# Patient Record
Sex: Female | Born: 1995 | Race: White | Hispanic: No | Marital: Single | Smoking: Never smoker
Health system: Southern US, Community
[De-identification: ages and names within clinical notes are randomized; demographics above are authoritative.]

## PROBLEM LIST (undated history)

## (undated) ENCOUNTER — Inpatient Hospital Stay (HOSPITAL_COMMUNITY): Payer: Self-pay

## (undated) DIAGNOSIS — F909 Attention-deficit hyperactivity disorder, unspecified type: Secondary | ICD-10-CM

## (undated) DIAGNOSIS — Z789 Other specified health status: Secondary | ICD-10-CM

## (undated) HISTORY — DX: Attention-deficit hyperactivity disorder, unspecified type: F90.9

## (undated) HISTORY — PX: WISDOM TOOTH EXTRACTION: SHX21

## (undated) HISTORY — PX: NO PAST SURGERIES: SHX2092

---
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
## 2013-02-02 IMAGING — US US PELVIS COMPLETE
1 series · 14 of 25 positions shown · non-contrast
Comparison: None.

CLINICAL DATA: Right-sided pelvic pain.  Irregular menses.  History
of ovarian cysts.  The



[Series 1: us pelvis complete · 0.24mm/px · 14 of 71 slices shown]
[im 1/71]
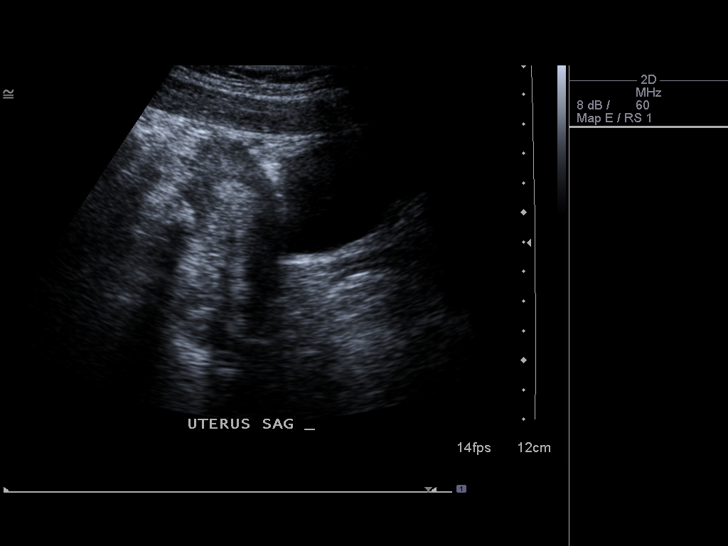
[im 6/71]
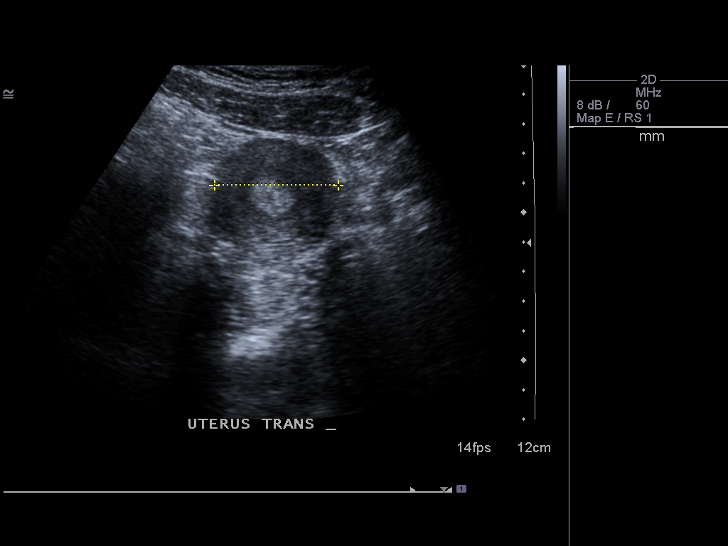
[im 12/71]
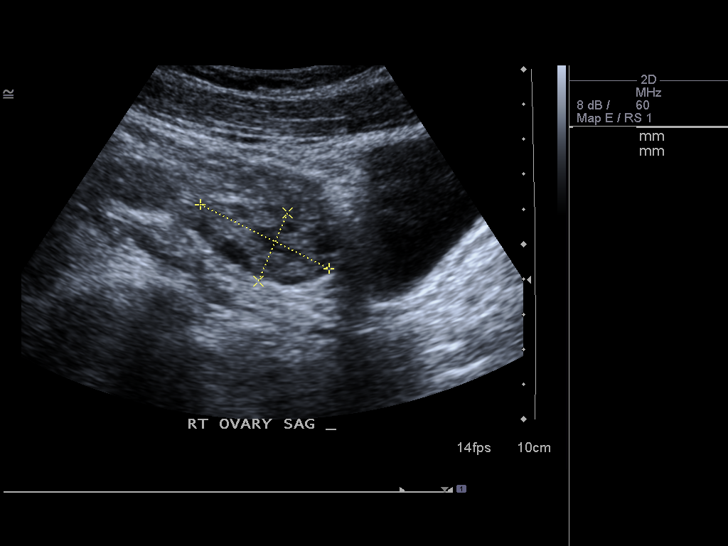
[im 18/71]
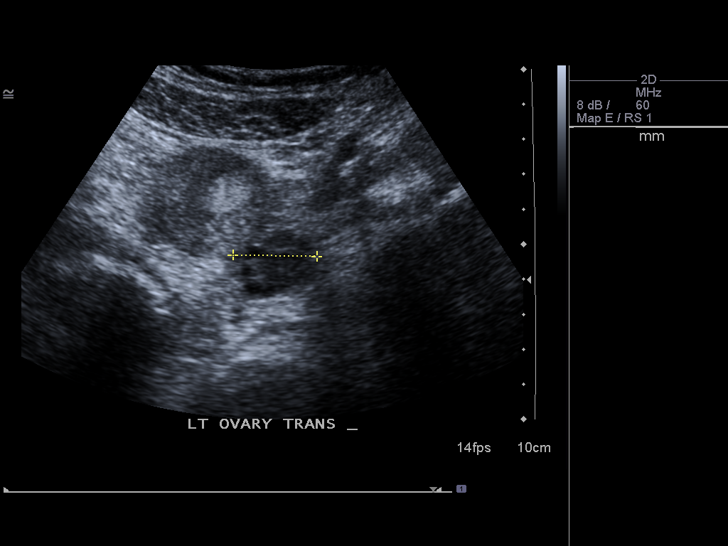
[im 24/71]
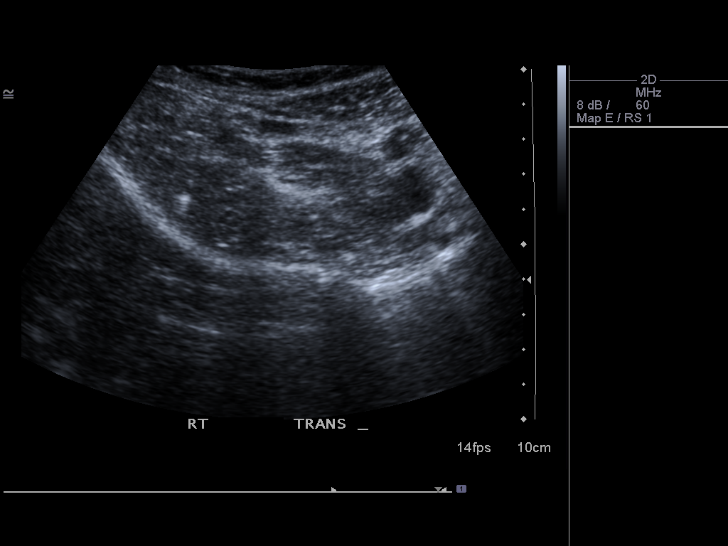
[im 27/71]
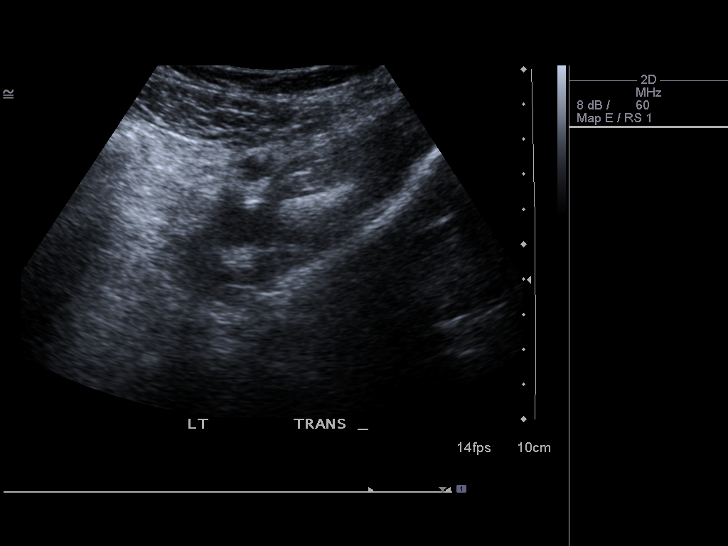
[im 33/71]
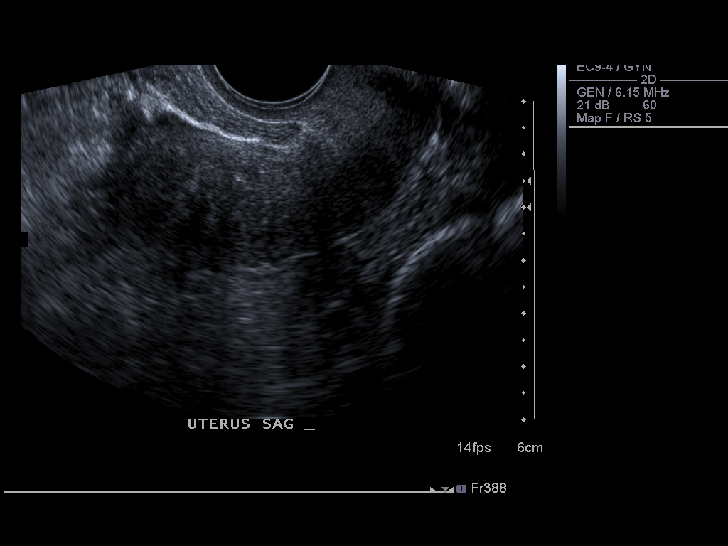
[im 38/71]
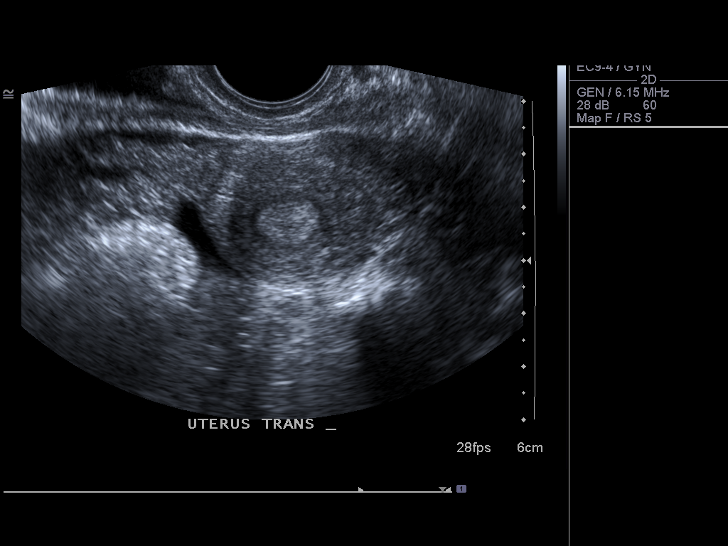
[im 44/71]
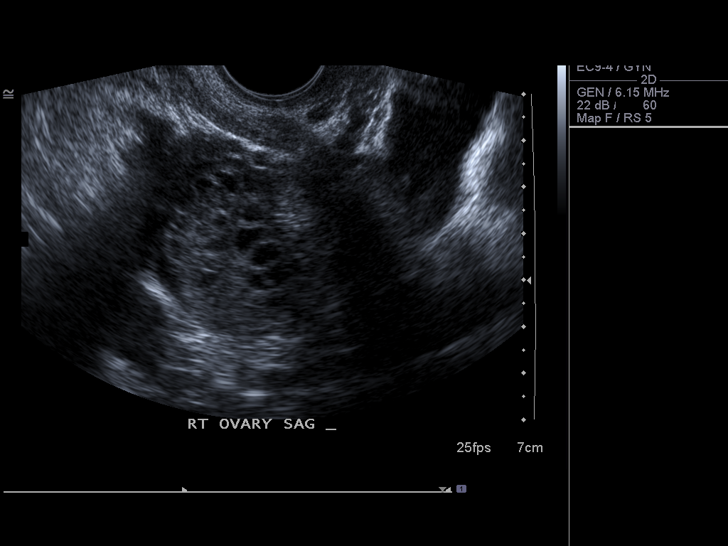
[im 47/71]
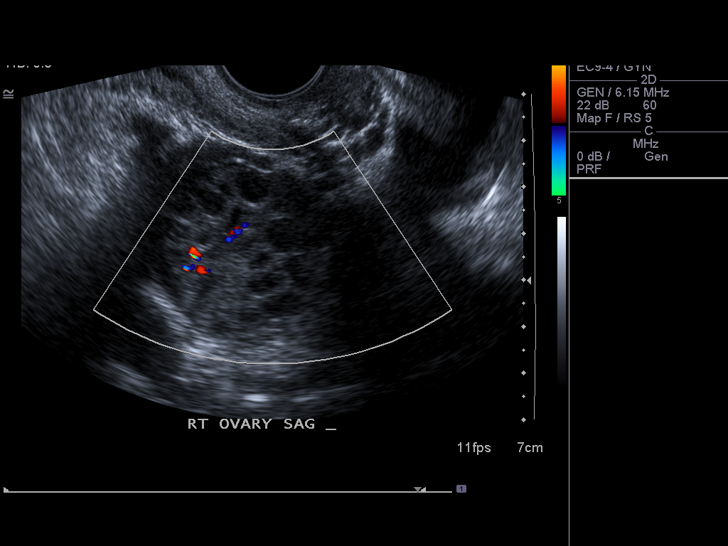
[im 53/71]
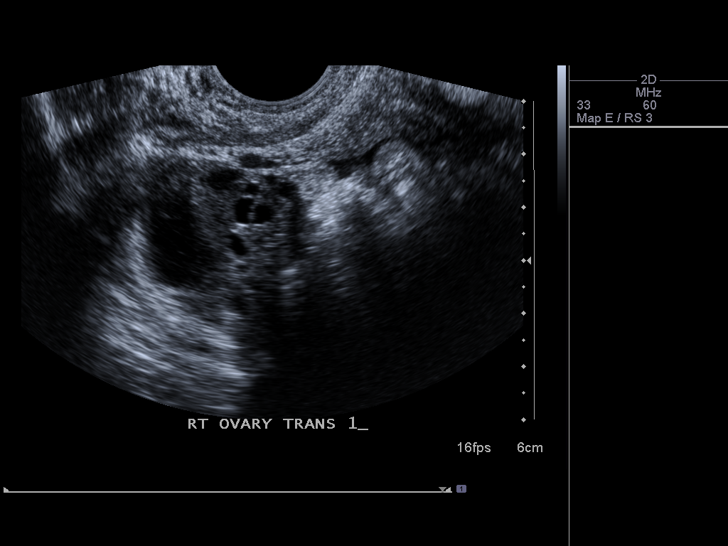
[im 59/71]
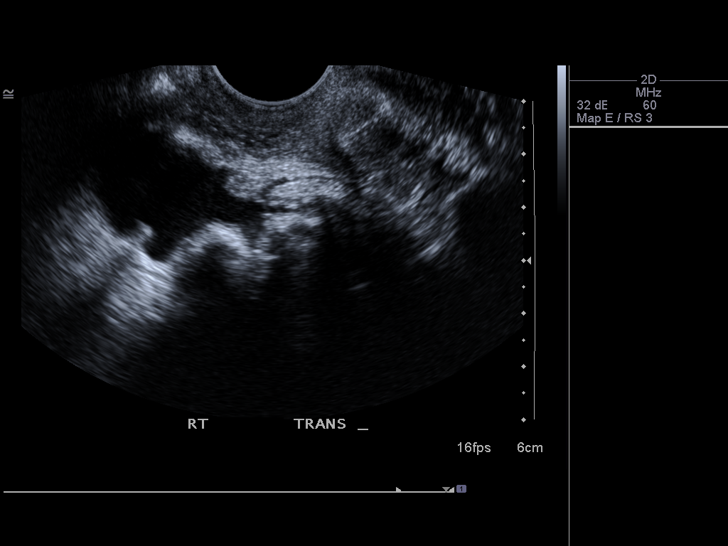
[im 65/71]
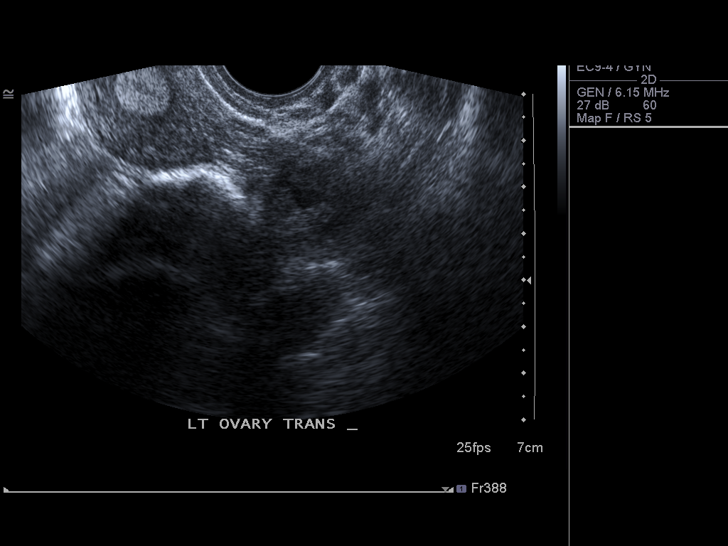
[im 71/71]
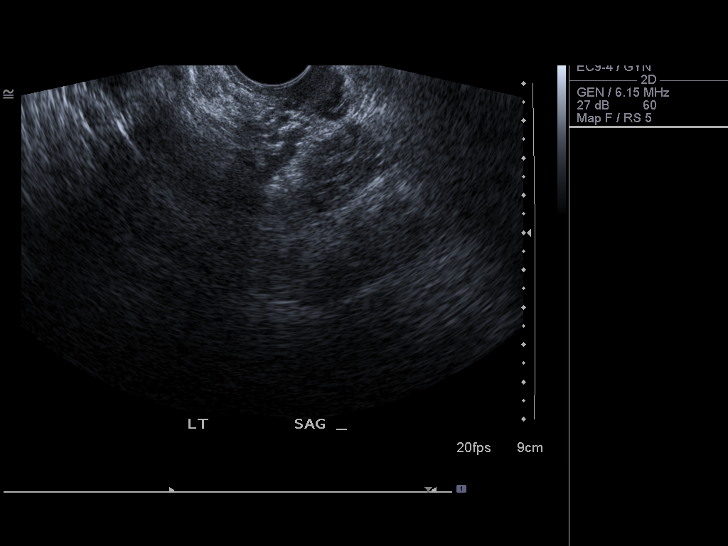

[14 of 25 positions shown; findings below may reference images not displayed]

FINDINGS: Uterus:  7.2 x 3.2 x 4.6 cm.  No fibroids other uterine mass
identified.

Endometrium: Double layer thickness measures 12 mm transvaginally.
No focal lesion visualized.

Right ovary: 4.1 x 3.1 x 2.7 cm.  Normal appearance with 2 cm
corpus luteum noted.

Left ovary: 3.3 x 2.1 x 2.0 cm.  Normal appearance.

Other Findings:  A small amount of free fluid in right adnexa and
cul-de-sac, likely physiologic.
IMPRESSION: 1.  2 cm right ovarian corpus luteum, and small amount of free
fluid which is likely physiologic.
2.  No pelvic masses or other significant abnormality identified.

## 2013-06-23 LAB — OB RESULTS CONSOLE ANTIBODY SCREEN: Antibody Screen: NEGATIVE

## 2013-06-23 LAB — OB RESULTS CONSOLE GC/CHLAMYDIA
CHLAMYDIA, DNA PROBE: NEGATIVE
GC PROBE AMP, GENITAL: NEGATIVE

## 2013-06-23 LAB — OB RESULTS CONSOLE ABO/RH: RH Type: POSITIVE

## 2013-06-23 LAB — OB RESULTS CONSOLE HEPATITIS B SURFACE ANTIGEN: HEP B S AG: NEGATIVE

## 2013-06-23 LAB — OB RESULTS CONSOLE HIV ANTIBODY (ROUTINE TESTING): HIV: NONREACTIVE

## 2013-06-23 LAB — OB RESULTS CONSOLE RUBELLA ANTIBODY, IGM: RUBELLA: IMMUNE

## 2013-06-23 LAB — OB RESULTS CONSOLE RPR: RPR: NONREACTIVE

## 2013-11-26 ENCOUNTER — Encounter (HOSPITAL_COMMUNITY): Payer: Self-pay | Admitting: *Deleted

## 2013-11-26 ENCOUNTER — Inpatient Hospital Stay (HOSPITAL_COMMUNITY)
Admission: AD | Admit: 2013-11-26 | Discharge: 2013-11-27 | Disposition: A | Discharge status: Home / Self Care | Payer: 59 | Source: Ambulatory Visit | Attending: Obstetrics | Admitting: Obstetrics

## 2013-11-26 DIAGNOSIS — O4700 False labor before 37 completed weeks of gestation, unspecified trimester: Secondary | ICD-10-CM | POA: Insufficient documentation

## 2013-11-26 DIAGNOSIS — O4703 False labor before 37 completed weeks of gestation, third trimester: Secondary | ICD-10-CM

## 2013-11-26 LAB — URINALYSIS, ROUTINE W REFLEX MICROSCOPIC
BILIRUBIN URINE: NEGATIVE
Glucose, UA: NEGATIVE mg/dL
HGB URINE DIPSTICK: NEGATIVE
KETONES UR: 40 mg/dL — AB
Leukocytes, UA: NEGATIVE
Nitrite: NEGATIVE
PH: 7.5 (ref 5.0–8.0)
Protein, ur: NEGATIVE mg/dL
SPECIFIC GRAVITY, URINE: 1.01 (ref 1.005–1.030)
Urobilinogen, UA: 1 mg/dL (ref 0.0–1.0)

## 2013-11-26 MED ORDER — NALBUPHINE HCL 10 MG/ML IJ SOLN
5.0000 mg | Freq: Once | INTRAMUSCULAR | Status: AC
  Administered 2013-11-26: 5 mg via INTRAVENOUS
  Filled 2013-11-26: qty 1

## 2013-11-26 MED ORDER — LACTATED RINGERS IV BOLUS (SEPSIS)
1000.0000 mL | Freq: Once | INTRAVENOUS | Status: AC
  Administered 2013-11-26: 1000 mL via INTRAVENOUS

## 2013-11-26 NOTE — MAU Provider Note (Signed)
History     CSN: [id number]  Arrival date and time: 11/26/13 2057   None     Chief Complaint  Patient presents with  . Abdominal Pain  . Back Pain   HPI 18 y.o. G1P0 at [redacted]w[redacted]d w/ contractions since 1830 tonight. No bleeding or LOF. Intercourse 2 hours prior to onset of UCs. + fetal movement. Uncomplicated prenatal course.   Past Medical History  Diagnosis Date  . Depressed   . Asthma     Past Surgical History  Procedure Laterality Date  . Wisdom tooth extraction      Family History  Problem Relation Age of Onset  . Alcohol abuse Neg Hx     History  Substance Use Topics  . Smoking status: Never Smoker   . Smokeless tobacco: Never Used  . Alcohol Use: No    Allergies: No Known Allergies  Prescriptions prior to admission  Medication Sig Dispense Refill  . ondansetron (ZOFRAN) 8 MG tablet Take by mouth every 8 (eight) hours as needed for nausea or vomiting.      . Prenatal Vit-Fe Fumarate-FA (PRENATAL MULTIVITAMIN) TABS tablet Take 1 tablet by mouth daily at 12 noon.      Marland Kitchen albuterol (PROVENTIL HFA;VENTOLIN HFA) 108 (90 BASE) MCG/ACT inhaler Inhale 2 puffs into the lungs every 6 (six) hours as needed for wheezing.  1 Inhaler  0  . ciprofloxacin (CIPRO) 500 MG tablet Take 1 tablet (500 mg total) by mouth 2 (two) times daily.  20 tablet  0  . escitalopram (LEXAPRO) 10 MG tablet Take 10 mg by mouth daily.      . nitrofurantoin, macrocrystal-monohydrate, (MACROBID) 100 MG capsule Take 1 capsule (100 mg total) by mouth 2 (two) times daily.  10 capsule  0  . Norethin Ace-Eth Estrad-FE (MINASTRIN 24 FE PO) Take by mouth.      . Pediatric Multivitamins-Fl (CHEWABLE MULTIVITE/FL PO) Take 1 tablet by mouth daily.      Marland Kitchen Spacer/Aero-Hold Chamber Bags MISC 1 Device by Does not apply route 4 (four) times daily as needed.  1 each  0    Review of Systems  Constitutional: Negative.   Respiratory: Negative.   Cardiovascular: Negative.   Gastrointestinal: Negative for nausea,  vomiting, abdominal pain, diarrhea and constipation.  Genitourinary: Negative for dysuria, urgency, frequency, hematuria and flank pain.       Negative for vaginal bleeding, + cramping/contractions  Musculoskeletal: Negative.   Neurological: Negative.   Psychiatric/Behavioral: Negative.    Physical Exam   Blood pressure 116/74, pulse 76, temperature 98.3 F (36.8 C), temperature source Oral, resp. rate 18, height 5' 8.5" (1.74 m), weight 169 lb 6.4 oz (76.839 kg).  Physical Exam  Nursing note and vitals reviewed. Constitutional: She is oriented to person, place, and time. She appears well-developed and well-nourished. No distress.  Cardiovascular: Normal rate.   Respiratory: Effort normal.  GI: Soft. There is no tenderness.  Genitourinary: No vaginal discharge found.   FT/thick/-2/posterior/cephalic  Musculoskeletal: Normal range of motion.  Neurological: She is alert and oriented to person, place, and time.  Skin: Skin is warm and dry.  Psychiatric: She has a normal mood and affect.   EFM: 125, mod variability, + accels TOCO: q3-4 min  MAU Course  Procedures      Follow-up Information   Follow up with Marlow Baars, MD. (as scheduled or sooner as needed)    Specialty:  Obstetrics   Contact information:   [address] [place] [address]  [phone]      Results for orders placed during the hospital encounter of 11/26/13 (from the past 24 hour(s))  URINALYSIS, ROUTINE W REFLEX MICROSCOPIC     Status: Abnormal   Collection Time    11/26/13  9:16 PM      Result Value Ref Range   Color, Urine YELLOW  YELLOW   APPearance CLEAR  CLEAR   Specific Gravity, Urine 1.010  1.005 - 1.030   pH 7.5  5.0 - 8.0   Glucose, UA NEGATIVE  NEGATIVE mg/dL   Hgb urine dipstick NEGATIVE  NEGATIVE   Bilirubin Urine NEGATIVE  NEGATIVE   Ketones, ur 40 (*) NEGATIVE mg/dL   Protein, ur NEGATIVE  NEGATIVE mg/dL   Urobilinogen, UA 1.0  0.0 - 1.0 mg/dL   Nitrite NEGATIVE   NEGATIVE   Leukocytes, UA NEGATIVE  NEGATIVE   IV hydration, Nubain 5 mg IV, pt still uncomfortable about 1 hour later, still contracting, though appear to be spacing out some (difficult to trace), pt much more comfortable and reporting fewer UCs following a second dose of Nubain 5 mg IV, repeat cervical exam with no change  Assessment and Plan   1. Preterm contractions, third trimester   No evidence of labor, rev'd precautions, encouraged hydration, f/u on Wednesday as scheduled or sooner PRN    Medication List    STOP taking these medications       CHEWABLE MULTIVITE/FL PO     escitalopram 10 MG tablet  Commonly known as:  LEXAPRO     MINASTRIN 24 FE PO     nitrofurantoin (macrocrystal-monohydrate) 100 MG capsule  Commonly known as:  MACROBID     Spacer/Aero-Hold Chamber Bags Misc      TAKE these medications       albuterol 108 (90 BASE) MCG/ACT inhaler  Commonly known as:  PROVENTIL HFA;VENTOLIN HFA  Inhale 2 puffs into the lungs every 6 (six) hours as needed for wheezing.     ciprofloxacin 500 MG tablet  Commonly known as:  CIPRO  Take 1 tablet (500 mg total) by mouth 2 (two) times daily.     ondansetron 8 MG tablet  Commonly known as:  ZOFRAN  Take by mouth every 8 (eight) hours as needed for nausea or vomiting.     prenatal multivitamin Tabs tablet  Take 1 tablet by mouth daily at 12 noon.        Follow-up Information   Follow up with Marlow Baars, MD. (as scheduled or sooner as needed)    Specialty:  Obstetrics   Contact information:   [address] [place] [address] [phone]         Georges Mouse 11/27/2013, 12:13 AM

## 2013-11-26 NOTE — MAU Note (Signed)
Since 1830 i've been feeling my abdomen tighten and having some back pain. Feels like baby has dropped.

## 2013-11-27 ENCOUNTER — Encounter (HOSPITAL_COMMUNITY): Payer: Self-pay | Admitting: Advanced Practice Midwife

## 2013-11-27 DIAGNOSIS — O4700 False labor before 37 completed weeks of gestation, unspecified trimester: Secondary | ICD-10-CM | POA: Diagnosis not present

## 2013-11-27 NOTE — Discharge Instructions (Signed)

## 2013-11-30 NOTE — MAU Provider Note (Signed)
I agree with the plan of care documented above by the CNM.  False labor.

## 2013-12-13 LAB — OB RESULTS CONSOLE GBS: GBS: NEGATIVE

## 2013-12-23 ENCOUNTER — Telehealth (HOSPITAL_COMMUNITY): Payer: Self-pay | Admitting: *Deleted

## 2013-12-23 ENCOUNTER — Encounter (HOSPITAL_COMMUNITY): Payer: Self-pay | Admitting: *Deleted

## 2013-12-23 NOTE — Telephone Encounter (Signed)
Preadmission screen  

## 2013-12-25 MED ORDER — TERBUTALINE SULFATE 1 MG/ML IJ SOLN
0.2500 mg | Freq: Once | INTRAMUSCULAR | Status: AC
  Administered 2013-12-26: 0.25 mg via SUBCUTANEOUS
  Filled 2013-12-25: qty 1

## 2013-12-26 ENCOUNTER — Encounter (HOSPITAL_COMMUNITY): Payer: Self-pay

## 2013-12-26 ENCOUNTER — Observation Stay (HOSPITAL_COMMUNITY)
Admission: RE | Admit: 2013-12-26 | Discharge: 2013-12-26 | Disposition: A | Discharge status: Home / Self Care | Payer: 59 | Source: Ambulatory Visit | Attending: Obstetrics | Admitting: Obstetrics

## 2013-12-26 VITALS — BP 116/78 | HR 82 | Temp 98.2°F | Ht 68.5 in | Wt 168.0 lb

## 2013-12-26 DIAGNOSIS — J45909 Unspecified asthma, uncomplicated: Secondary | ICD-10-CM | POA: Diagnosis not present

## 2013-12-26 DIAGNOSIS — IMO0002 Reserved for concepts with insufficient information to code with codable children: Secondary | ICD-10-CM | POA: Diagnosis not present

## 2013-12-26 DIAGNOSIS — F909 Attention-deficit hyperactivity disorder, unspecified type: Secondary | ICD-10-CM | POA: Insufficient documentation

## 2013-12-26 DIAGNOSIS — O329XX1 Maternal care for malpresentation of fetus, unspecified, fetus 1: Secondary | ICD-10-CM

## 2013-12-26 DIAGNOSIS — O329XX0 Maternal care for malpresentation of fetus, unspecified, not applicable or unspecified: Secondary | ICD-10-CM | POA: Diagnosis present

## 2013-12-26 LAB — CBC
HEMATOCRIT: 40.2 % (ref 36.0–46.0)
HEMOGLOBIN: 14.1 g/dL (ref 12.0–15.0)
MCH: 30.7 pg (ref 26.0–34.0)
MCHC: 35.1 g/dL (ref 30.0–36.0)
MCV: 87.4 fL (ref 78.0–100.0)
Platelets: 202 10*3/uL (ref 150–400)
RBC: 4.6 MIL/uL (ref 3.87–5.11)
RDW: 12.8 % (ref 11.5–15.5)
WBC: 7 10*3/uL (ref 4.0–10.5)

## 2013-12-26 MED ORDER — LACTATED RINGERS IV SOLN: 500.0000 mL | Freq: Once | INTRAVENOUS | Status: DC

## 2013-12-26 MED ORDER — CITRIC ACID-SODIUM CITRATE 334-500 MG/5ML PO SOLN
30.0000 mL | ORAL | Status: DC | PRN
  Filled 2013-12-26: qty 15

## 2013-12-26 MED ORDER — PHENYLEPHRINE 40 MCG/ML (10ML) SYRINGE FOR IV PUSH (FOR BLOOD PRESSURE SUPPORT): 80.0000 ug | PREFILLED_SYRINGE | INTRAVENOUS | Status: DC | PRN

## 2013-12-26 MED ORDER — TERBUTALINE SULFATE 1 MG/ML IJ SOLN
INTRAMUSCULAR | Status: AC
  Administered 2013-12-26: 0.25 mg via SUBCUTANEOUS
  Filled 2013-12-26: qty 1

## 2013-12-26 MED ORDER — LACTATED RINGERS IV SOLN: 500.0000 mL | INTRAVENOUS | Status: DC | PRN

## 2013-12-26 MED ORDER — EPHEDRINE 5 MG/ML INJ: 10.0000 mg | INTRAVENOUS | Status: DC | PRN

## 2013-12-26 MED ORDER — ONDANSETRON HCL 4 MG/2ML IJ SOLN: 4.0000 mg | Freq: Four times a day (QID) | INTRAMUSCULAR | Status: DC | PRN

## 2013-12-26 MED ORDER — LACTATED RINGERS IV SOLN: INTRAVENOUS | Status: DC

## 2013-12-26 MED ORDER — FENTANYL 2.5 MCG/ML BUPIVACAINE 1/10 % EPIDURAL INFUSION (WH - ANES): 14.0000 mL/h | INTRAMUSCULAR | Status: DC | PRN

## 2013-12-26 MED ORDER — DIPHENHYDRAMINE HCL 50 MG/ML IJ SOLN: 12.5000 mg | INTRAMUSCULAR | Status: DC | PRN

## 2013-12-26 NOTE — Discharge Summary (Signed)
Obstetric Discharge Summary Reason for Admission: fetal malpresentation, attempted ECV Prenatal Procedures: ECV Complications-Operative and Postpartum: attempted ECV, not successful1 hr of fetal monitoring performed after attempted version, Cat 1 and reassuring.   Hemoglobin  Date Value Ref Range Status  12/26/2013 14.1  12.0 - 15.0 g/dL Final     HCT  Date Value Ref Range Status  12/26/2013 40.2  36.0 - 46.0 % Final    Physical Exam:  General: alert, cooperative and no distress Uterus: soft  Discharge Diagnoses: fetal malpresentation  Discharge Information: Date: 12/26/2013 Activity: unrestricted Diet: routine Medications: PNV Condition: stable Instructions: refer to practice specific booklet Discharge to: home   Marlow Baars 12/26/2013, 9:39 AM

## 2013-12-26 NOTE — Discharge Instructions (Signed)
External Cephalic Version External cephalic version is turning a baby that is presenting his or her buttocks first (breech) or is lying sideways in the uterus (transverse) to a head-first position. This makes the labor and delivery faster, safer for the mother and baby, and lessens the chance for a cesarean section. It should not be tried until the pregnancy is [redacted] weeks along or longer.   HOME CARE INSTRUCTIONS   Have someone take you home after the procedure.  Rest at home for several hours.  Have someone stay with you for a few hours after you get home.  After ECV, continue with your prenatal visits as directed.  Continue your regular diet, rest and activities.  Do not do any strenuous activities for a couple of days. SEEK IMMEDIATE MEDICAL CARE IF:   You develop vaginal bleeding.  You have fluid coming out of your vagina (bag of water may have broken).  You develop uterine contractions.  You do not feel the baby move or there is less movement of the baby.  You develop abdominal pain.  You develop an oral temperature of 101 F (38.9 C) or higher. Document Released: 09/09/2006 Document Revised: 08/01/2013 Document Reviewed: 07/05/2008 [facility] Patient Information 2015 [place], [place]. This information is not intended to replace advice given to you by your health care provider. Make sure you discuss any questions you have with your health care provider. Braxton Hicks Contractions Contractions of the uterus can occur throughout pregnancy. Contractions are not always a sign that you are in labor.  WHAT ARE BRAXTON HICKS CONTRACTIONS?  Contractions that occur before labor are called Braxton Hicks contractions, or false labor. Toward the end of pregnancy (32-34 weeks), these contractions can develop more often and may become more forceful. This is not true labor because these contractions do not result in opening (dilatation) and thinning of the cervix. They are sometimes difficult to  tell apart from true labor because these contractions can be forceful and people have different pain tolerances. You should not feel embarrassed if you go to the hospital with false labor. Sometimes, the only way to tell if you are in true labor is for your health care provider to look for changes in the cervix. If there are no prenatal problems or other health problems associated with the pregnancy, it is completely safe to be sent home with false labor and await the onset of true labor. HOW CAN YOU TELL THE DIFFERENCE BETWEEN TRUE AND FALSE LABOR? False Labor  The contractions of false labor are usually shorter and not as hard as those of true labor.   The contractions are usually irregular.   The contractions are often felt in the front of the lower abdomen and in the groin.   The contractions may go away when you walk around or change positions while lying down.   The contractions get weaker and are shorter lasting as time goes on.   The contractions do not usually become progressively stronger, regular, and closer together as with true labor.  True Labor  Contractions in true labor last 30-70 seconds, become very regular, usually become more intense, and increase in frequency.   The contractions do not go away with walking.   The discomfort is usually felt in the top of the uterus and spreads to the lower abdomen and low back.   True labor can be determined by your health care provider with an exam. This will show that the cervix is dilating and getting thinner.  WHAT TO REMEMBER  Keep up with your usual exercises and follow other instructions given by your health care provider.   Take medicines as directed by your health care provider.   Keep your regular prenatal appointments.   Eat and drink lightly if you think you are going into labor.   If Braxton Hicks contractions are making you uncomfortable:   Change your position from lying down or resting to  walking, or from walking to resting.   Sit and rest in a tub of warm water.   Drink 2-3 glasses of water. Dehydration may cause these contractions.   Do slow and deep breathing several times an hour.  WHEN SHOULD I SEEK IMMEDIATE MEDICAL CARE? Seek immediate medical care if:  Your contractions become stronger, more regular, and closer together.   You have fluid leaking or gushing from your vagina.   You have a fever.   You pass blood-tinged mucus.   You have vaginal bleeding.   You have continuous abdominal pain.   You have low back pain that you never had before.   You feel your baby's head pushing down and causing pelvic pressure.   Your baby is not moving as much as it used to.  Document Released: 03/17/2005 Document Revised: 03/22/2013 Document Reviewed: 12/27/2012 [facility] Patient Information 2015 [place], [place]. This information is not intended to replace advice given to you by your health care provider. Make sure you discuss any questions you have with your health care provider. Fetal Movement Counts Patient Name: __________________________________________________ Patient Due Date: ____________________ Performing a fetal movement count is highly recommended in high-risk pregnancies, but it is good for every pregnant woman to do. Your health care provider may ask you to start counting fetal movements at 28 weeks of the pregnancy. Fetal movements often increase:  After eating a full meal.  After physical activity.  After eating or drinking something sweet or cold.  At rest. Pay attention to when you feel the baby is most active. This will help you notice a pattern of your baby's sleep and wake cycles and what factors contribute to an increase in fetal movement. It is important to perform a fetal movement count at the same time each day when your baby is normally most active.  HOW TO COUNT FETAL MOVEMENTS 1. Find a quiet and comfortable area to sit or lie  down on your left side. Lying on your left side provides the best blood and oxygen circulation to your baby. 2. Write down the day and time on a sheet of paper or in a journal. 3. Start counting kicks, flutters, swishes, rolls, or jabs in a 2-hour period. You should feel at least 10 movements within 2 hours. 4. If you do not feel 10 movements in 2 hours, wait 2-3 hours and count again. Look for a change in the pattern or not enough counts in 2 hours. SEEK MEDICAL CARE IF:  You feel less than 10 counts in 2 hours, tried twice.  There is no movement in over an hour.  The pattern is changing or taking longer each day to reach 10 counts in 2 hours.  You feel the baby is not moving as he or she usually does. Date: ____________ Movements: ____________ Start time: ____________ Doreatha Martin time: ____________  Date: ____________ Movements: ____________ Start time: ____________ Doreatha Martin time: ____________ Date: ____________ Movements: ____________ Start time: ____________ Doreatha Martin time: ____________ Date: ____________ Movements: ____________ Start time: ____________ Doreatha Martin time: ____________ Date: ____________ Movements: ____________ Start time: ____________  Finish time: ____________ Date: ____________ Movements: ____________ Start time: ____________ Doreatha Martin time: ____________ Date: ____________ Movements: ____________ Start time: ____________ Doreatha Martin time: ____________ Date: ____________ Movements: ____________ Start time: ____________ Doreatha Martin time: ____________  Date: ____________ Movements: ____________ Start time: ____________ Doreatha Martin time: ____________ Date: ____________ Movements: ____________ Start time: ____________ Doreatha Martin time: ____________ Date: ____________ Movements: ____________ Start time: ____________ Doreatha Martin time: ____________ Date: ____________ Movements: ____________ Start time: ____________ Doreatha Martin time: ____________ Date: ____________ Movements: ____________ Start time: ____________ Doreatha Martin time:  ____________ Date: ____________ Movements: ____________ Start time: ____________ Doreatha Martin time: ____________ Date: ____________ Movements: ____________ Start time: ____________ Doreatha Martin time: ____________  Date: ____________ Movements: ____________ Start time: ____________ Doreatha Martin time: ____________ Date: ____________ Movements: ____________ Start time: ____________ Doreatha Martin time: ____________ Date: ____________ Movements: ____________ Start time: ____________ Doreatha Martin time: ____________ Date: ____________ Movements: ____________ Start time: ____________ Doreatha Martin time: ____________ Date: ____________ Movements: ____________ Start time: ____________ Doreatha Martin time: ____________ Date: ____________ Movements: ____________ Start time: ____________ Doreatha Martin time: ____________ Date: ____________ Movements: ____________ Start time: ____________ Doreatha Martin time: ____________  Date: ____________ Movements: ____________ Start time: ____________ Doreatha Martin time: ____________ Date: ____________ Movements: ____________ Start time: ____________ Doreatha Martin time: ____________ Date: ____________ Movements: ____________ Start time: ____________ Doreatha Martin time: ____________ Date: ____________ Movements: ____________ Start time: ____________ Doreatha Martin time: ____________ Date: ____________ Movements: ____________ Start time: ____________ Doreatha Martin time: ____________ Date: ____________ Movements: ____________ Start time: ____________ Doreatha Martin time: ____________ Date: ____________ Movements: ____________ Start time: ____________ Doreatha Martin time: ____________  Date: ____________ Movements: ____________ Start time: ____________ Doreatha Martin time: ____________ Date: ____________ Movements: ____________ Start time: ____________ Doreatha Martin time: ____________ Date: ____________ Movements: ____________ Start time: ____________ Doreatha Martin time: ____________ Date: ____________ Movements: ____________ Start time: ____________ Doreatha Martin time: ____________ Date: ____________ Movements:  ____________ Start time: ____________ Doreatha Martin time: ____________ Date: ____________ Movements: ____________ Start time: ____________ Doreatha Martin time: ____________ Date: ____________ Movements: ____________ Start time: ____________ Doreatha Martin time: ____________  Date: ____________ Movements: ____________ Start time: ____________ Doreatha Martin time: ____________ Date: ____________ Movements: ____________ Start time: ____________ Doreatha Martin time: ____________ Date: ____________ Movements: ____________ Start time: ____________ Doreatha Martin time: ____________ Date: ____________ Movements: ____________ Start time: ____________ Doreatha Martin time: ____________ Date: ____________ Movements: ____________ Start time: ____________ Doreatha Martin time: ____________ Date: ____________ Movements: ____________ Start time: ____________ Doreatha Martin time: ____________ Date: ____________ Movements: ____________ Start time: ____________ Doreatha Martin time: ____________  Date: ____________ Movements: ____________ Start time: ____________ Doreatha Martin time: ____________ Date: ____________ Movements: ____________ Start time: ____________ Doreatha Martin time: ____________ Date: ____________ Movements: ____________ Start time: ____________ Doreatha Martin time: ____________ Date: ____________ Movements: ____________ Start time: ____________ Doreatha Martin time: ____________ Date: ____________ Movements: ____________ Start time: ____________ Doreatha Martin time: ____________ Date: ____________ Movements: ____________ Start time: ____________ Doreatha Martin time: ____________ Date: ____________ Movements: ____________ Start time: ____________ Doreatha Martin time: ____________  Date: ____________ Movements: ____________ Start time: ____________ Doreatha Martin time: ____________ Date: ____________ Movements: ____________ Start time: ____________ Doreatha Martin time: ____________ Date: ____________ Movements: ____________ Start time: ____________ Doreatha Martin time: ____________ Date: ____________ Movements: ____________ Start time: ____________ Doreatha Martin  time: ____________ Date: ____________ Movements: ____________ Start time: ____________ Doreatha Martin time: ____________ Date: ____________ Movements: ____________ Start time: ____________ Doreatha Martin time: ____________ Document Released: 04/16/2006 Document Revised: 08/01/2013 Document Reviewed: 01/12/2012 [employer] Patient Information 2015 [place]. This information is not intended to replace advice given to you by your health care provider. Make sure you discuss any questions you have with your health care provider.

## 2013-12-26 NOTE — Procedures (Addendum)
Procedure Note  Surgeons: Marlow Baars, MD and Malva Limes, MD  Bedside ultrasound confirmed complete breech position prior to the start of the procedure.  The breech was not well engaged in the pelvis.  The head was in the LUQ.  Spine was lateral/anterior.  Placenta was anterior/left lateral.   Fluid was normal. Patient declined epidural anesthesia.  0.25mg  [place] terbutaline was given 5 minutes prior to the start of the procedure.  Fetal monitoring prior to the start of the procedure was reassuring. The breech was elevated out of of the pelvis.  Slow steady pressure was used to attempt a backward somersault into the vertex position twice.  An additional attempt at a forward somersault to obtain a vertex position was made.  All attempts were unsuccessful.  The breech was easily elevated out of the pelvis, but the head was unable to be displaced from the LUQ.  Fetal heart tones were obtained between each attempt.  One decel to the 70s was noted after the first version attempt.  This returned to baseline w good variability prior to the next attempt.  After 3 attempts at version, due to patient discomfort, the procedure was stopped.  Fetal monitors were replaced w reassuring fetal status.  Following 1 hr of monitoring, the patient will be discharged home.

## 2013-12-26 NOTE — H&P (Signed)
18 y.o. G1P0 @ [redacted]w[redacted]d presents for external cephalic version.  [place] last week showed complete breech presentation.  Otherwise has good fetal movement and no bleeding.  Denies ctx Placenta Anterior   Past Medical History  Diagnosis Date  . Asthma     exercise induced  . ADHD (attention deficit hyperactivity disorder)     Past Surgical History  Procedure Laterality Date  . Wisdom tooth extraction      OB History  Gravida Para Term Preterm AB SAB TAB Ectopic Multiple Living  2 0   1 1    0    # Outcome Date GA Lbr Len/2nd Weight Sex Delivery Anes PTL Lv  2 CUR           1 SAB               History   Social History  . Marital Status: Single    Spouse Name: N/A    Number of Children: N/A  . Years of Education: N/A   Occupational History  . Not on file.   Social History Main Topics  . Smoking status: Never Smoker   . Smokeless tobacco: Never Used  . Alcohol Use: No  . Drug Use: No  . Sexual Activity: Yes   Other Topics Concern  . Not on file   Social History Narrative  . No narrative on file   Review of patient's allergies indicates no known allergies.    Prenatal Transfer Tool  Maternal Diabetes: No Genetic Screening: Normal Maternal Ultrasounds/Referrals: complete breech, anterior placenta Fetal Ultrasounds or other Referrals:  None Maternal Substance Abuse:  No Significant Maternal Medications:  None Significant Maternal Lab Results: Lab values include: Group B Strep negative  Other PNC: uncomplicated.    There were no vitals filed for this visit.   General:  NAD Lungs: CTAB Cardiac: RRR Abdomen:  soft, gravid, EFW 6# Ex:  tr edema SVE:  Closed/long FHTs:  120s, mod var Toco:  Rare ctx  BSUS today confirms complete breech presentation.  Fetal head maternal left, spine anterior/lateral.  Placenta anterior.  AFI normal.    A/P   18 y.o. [redacted]w[redacted]d  G1P0 presents with complete breech presentation, confirmed on bedside ultrasound this AM.  Plan for ECV.   Epidural if pt desires (cbc pending), terb prior to start of procedure.  Reviewed risks of ECV, including PROM, placental abruption or NRFS resulting in urgent cesarean delivery, failure of version, or successful version followed by return to breech presentation prior to onset of spontaneous labor.  Pt agrees to ECV.  Consented for ECV and cesarean delivery    [place], [place]

## 2013-12-28 ENCOUNTER — Encounter (HOSPITAL_COMMUNITY): Payer: Self-pay

## 2013-12-28 NOTE — Patient Instructions (Addendum)
Your procedure is scheduled on:12/30/13  Enter through the Main Entrance at : 10 am Pick up desk phone and dial [phone] and inform us of your arrival.  Please call [phone] if you have any problems the morning of surgery.  Remember: Do not eat food or drink liquids, including water, after midnight:tonight Clear liquids are ok until:7am  You may brush your teeth the morning of surgery.   DO NOT wear jewelry, eye make-up, lipstick,body lotion, or dark fingernail polish.  (Polished toes are ok) You may wear deodorant.  If you are to be admitted after surgery, leave suitcase in car until your room has been assigned. Patients discharged on the day of surgery will not be allowed to drive home. Wear loose fitting, comfortable clothes for your ride home.

## 2013-12-29 ENCOUNTER — Encounter (HOSPITAL_COMMUNITY): Payer: Self-pay

## 2013-12-29 ENCOUNTER — Encounter (HOSPITAL_COMMUNITY)
Admission: RE | Admit: 2013-12-29 | Discharge: 2013-12-29 | Disposition: A | Discharge status: Home / Self Care | Payer: 59 | Source: Ambulatory Visit | Attending: Obstetrics | Admitting: Obstetrics

## 2013-12-29 HISTORY — DX: Other specified health status: Z78.9

## 2013-12-29 LAB — TYPE AND SCREEN
ABO/RH(D): O POS
Antibody Screen: NEGATIVE

## 2013-12-29 LAB — RPR

## 2013-12-29 LAB — CBC
HEMATOCRIT: 43 % (ref 36.0–46.0)
HEMOGLOBIN: 14.9 g/dL (ref 12.0–15.0)
MCH: 30.7 pg (ref 26.0–34.0)
MCHC: 34.7 g/dL (ref 30.0–36.0)
MCV: 88.7 fL (ref 78.0–100.0)
Platelets: 214 10*3/uL (ref 150–400)
RBC: 4.85 MIL/uL (ref 3.87–5.11)
RDW: 12.9 % (ref 11.5–15.5)
WBC: 7.7 10*3/uL (ref 4.0–10.5)

## 2013-12-29 LAB — ABO/RH: ABO/RH(D): O POS

## 2013-12-30 ENCOUNTER — Encounter (HOSPITAL_COMMUNITY): Payer: Self-pay | Admitting: Anesthesiology

## 2013-12-30 ENCOUNTER — Inpatient Hospital Stay (HOSPITAL_COMMUNITY)
Admission: RE | Admit: 2013-12-30 | Discharge: 2014-01-02 | DRG: 765 | Disposition: A | Discharge status: Home / Self Care | Payer: 59 | Source: Ambulatory Visit | Attending: Obstetrics | Admitting: Obstetrics

## 2013-12-30 ENCOUNTER — Encounter (HOSPITAL_COMMUNITY): Payer: 59 | Admitting: Anesthesiology

## 2013-12-30 ENCOUNTER — Encounter (HOSPITAL_COMMUNITY)
Admission: RE | Disposition: A | Discharge status: Home / Self Care | Payer: Self-pay | Source: Ambulatory Visit | Attending: Obstetrics

## 2013-12-30 ENCOUNTER — Inpatient Hospital Stay (HOSPITAL_COMMUNITY): Payer: 59 | Admitting: Anesthesiology

## 2013-12-30 DIAGNOSIS — O34593 Maternal care for other abnormalities of gravid uterus, third trimester: Secondary | ICD-10-CM | POA: Diagnosis present

## 2013-12-30 DIAGNOSIS — O329XX0 Maternal care for malpresentation of fetus, unspecified, not applicable or unspecified: Secondary | ICD-10-CM | POA: Diagnosis present

## 2013-12-30 DIAGNOSIS — O4103X0 Oligohydramnios, third trimester, not applicable or unspecified: Secondary | ICD-10-CM | POA: Diagnosis present

## 2013-12-30 DIAGNOSIS — O321XX0 Maternal care for breech presentation, not applicable or unspecified: Secondary | ICD-10-CM | POA: Diagnosis present

## 2013-12-30 DIAGNOSIS — Q514 Unicornate uterus: Secondary | ICD-10-CM | POA: Diagnosis not present

## 2013-12-30 DIAGNOSIS — Z3A39 39 weeks gestation of pregnancy: Secondary | ICD-10-CM | POA: Diagnosis present

## 2013-12-30 SURGERY — Surgical Case
Anesthesia: Spinal | Site: Abdomen

## 2013-12-30 MED ORDER — FENTANYL CITRATE 0.05 MG/ML IJ SOLN
INTRAMUSCULAR | Status: DC | PRN
  Administered 2013-12-30: 15 ug via INTRATHECAL

## 2013-12-30 MED ORDER — KETOROLAC TROMETHAMINE 30 MG/ML IJ SOLN
INTRAMUSCULAR | Status: AC
  Administered 2013-12-30: 30 mg via INTRAMUSCULAR
  Filled 2013-12-30: qty 1

## 2013-12-30 MED ORDER — LACTATED RINGERS IV SOLN
INTRAVENOUS | Status: DC
  Administered 2013-12-31: 10:00:00 via INTRAVENOUS

## 2013-12-30 MED ORDER — SIMETHICONE 80 MG PO CHEW
80.0000 mg | CHEWABLE_TABLET | Freq: Three times a day (TID) | ORAL | Status: DC
  Administered 2013-12-31 – 2014-01-02 (×7): 80 mg via ORAL
  Filled 2013-12-30 (×7): qty 1

## 2013-12-30 MED ORDER — ONDANSETRON HCL 4 MG/2ML IJ SOLN: 4.0000 mg | INTRAMUSCULAR | Status: DC | PRN

## 2013-12-30 MED ORDER — PRENATAL MULTIVITAMIN CH
1.0000 | ORAL_TABLET | Freq: Every day | ORAL | Status: DC
  Administered 2013-12-31 – 2014-01-02 (×3): 1 via ORAL
  Filled 2013-12-30 (×3): qty 1

## 2013-12-30 MED ORDER — MORPHINE SULFATE (PF) 0.5 MG/ML IJ SOLN
INTRAMUSCULAR | Status: DC | PRN
  Administered 2013-12-30: .1 mg via INTRATHECAL

## 2013-12-30 MED ORDER — PNEUMOCOCCAL VAC POLYVALENT 25 MCG/0.5ML IJ INJ
0.5000 mL | INJECTION | INTRAMUSCULAR | Status: DC
  Filled 2013-12-30: qty 0.5

## 2013-12-30 MED ORDER — OXYTOCIN 40 UNITS IN LACTATED RINGERS INFUSION - SIMPLE MED: 62.5000 mL/h | INTRAVENOUS | Status: AC

## 2013-12-30 MED ORDER — OXYTOCIN 10 UNIT/ML IJ SOLN
INTRAMUSCULAR | Status: AC
  Filled 2013-12-30: qty 4

## 2013-12-30 MED ORDER — MENTHOL 3 MG MT LOZG: 1.0000 | LOZENGE | OROMUCOSAL | Status: DC | PRN

## 2013-12-30 MED ORDER — MORPHINE SULFATE 0.5 MG/ML IJ SOLN
INTRAMUSCULAR | Status: AC
  Filled 2013-12-30: qty 10

## 2013-12-30 MED ORDER — KETOROLAC TROMETHAMINE 30 MG/ML IJ SOLN: 30.0000 mg | Freq: Four times a day (QID) | INTRAMUSCULAR | Status: AC | PRN

## 2013-12-30 MED ORDER — NALBUPHINE HCL 10 MG/ML IJ SOLN: 5.0000 mg | INTRAMUSCULAR | Status: DC | PRN

## 2013-12-30 MED ORDER — PHENYLEPHRINE 8 MG IN D5W 100 ML (0.08MG/ML) PREMIX OPTIME
INJECTION | INTRAVENOUS | Status: DC | PRN
  Administered 2013-12-30: 60 ug/min via INTRAVENOUS

## 2013-12-30 MED ORDER — SCOPOLAMINE 1 MG/3DAYS TD PT72: 1.0000 | MEDICATED_PATCH | Freq: Once | TRANSDERMAL | Status: DC

## 2013-12-30 MED ORDER — DIPHENHYDRAMINE HCL 25 MG PO CAPS
25.0000 mg | ORAL_CAPSULE | ORAL | Status: DC | PRN
  Filled 2013-12-30: qty 1

## 2013-12-30 MED ORDER — OXYTOCIN 40 UNITS IN LACTATED RINGERS INFUSION - SIMPLE MED
INTRAVENOUS | Status: DC | PRN
  Administered 2013-12-30: 40 [IU] via INTRAVENOUS

## 2013-12-30 MED ORDER — FENTANYL CITRATE 0.05 MG/ML IJ SOLN
INTRAMUSCULAR | Status: AC
  Filled 2013-12-30: qty 2

## 2013-12-30 MED ORDER — OXYCODONE-ACETAMINOPHEN 5-325 MG PO TABS
2.0000 | ORAL_TABLET | ORAL | Status: DC | PRN
  Administered 2013-12-31 – 2014-01-02 (×5): 2 via ORAL
  Filled 2013-12-30 (×5): qty 2

## 2013-12-30 MED ORDER — CEFAZOLIN SODIUM-DEXTROSE 2-3 GM-% IV SOLR
INTRAVENOUS | Status: AC
  Filled 2013-12-30: qty 50

## 2013-12-30 MED ORDER — CEFAZOLIN SODIUM-DEXTROSE 2-3 GM-% IV SOLR
2.0000 g | INTRAVENOUS | Status: AC
  Administered 2013-12-30: 2 g via INTRAVENOUS

## 2013-12-30 MED ORDER — NALBUPHINE HCL 10 MG/ML IJ SOLN: 5.0000 mg | Freq: Once | INTRAMUSCULAR | Status: AC | PRN

## 2013-12-30 MED ORDER — PHENYLEPHRINE 8 MG IN D5W 100 ML (0.08MG/ML) PREMIX OPTIME
INJECTION | INTRAVENOUS | Status: AC
  Filled 2013-12-30: qty 100

## 2013-12-30 MED ORDER — ONDANSETRON HCL 4 MG/2ML IJ SOLN
INTRAMUSCULAR | Status: AC
  Filled 2013-12-30: qty 2

## 2013-12-30 MED ORDER — LACTATED RINGERS IV SOLN
Freq: Once | INTRAVENOUS | Status: AC
  Administered 2013-12-30: 10:00:00 via INTRAVENOUS

## 2013-12-30 MED ORDER — INFLUENZA VAC SPLIT QUAD 0.5 ML IM SUSY
0.5000 mL | PREFILLED_SYRINGE | INTRAMUSCULAR | Status: AC
  Administered 2013-12-31: 0.5 mL via INTRAMUSCULAR
  Filled 2013-12-30: qty 0.5

## 2013-12-30 MED ORDER — PHENYLEPHRINE 40 MCG/ML (10ML) SYRINGE FOR IV PUSH (FOR BLOOD PRESSURE SUPPORT)
PREFILLED_SYRINGE | INTRAVENOUS | Status: AC
  Filled 2013-12-30: qty 5

## 2013-12-30 MED ORDER — SENNOSIDES-DOCUSATE SODIUM 8.6-50 MG PO TABS
2.0000 | ORAL_TABLET | ORAL | Status: DC
  Administered 2013-12-30 – 2014-01-01 (×3): 2 via ORAL
  Filled 2013-12-30 (×3): qty 2

## 2013-12-30 MED ORDER — DEXTROSE 5 % IV SOLN
1.0000 ug/kg/h | INTRAVENOUS | Status: DC | PRN
  Filled 2013-12-30: qty 2

## 2013-12-30 MED ORDER — DIBUCAINE 1 % RE OINT: 1.0000 "application " | TOPICAL_OINTMENT | RECTAL | Status: DC | PRN

## 2013-12-30 MED ORDER — SIMETHICONE 80 MG PO CHEW
80.0000 mg | CHEWABLE_TABLET | ORAL | Status: DC
  Administered 2013-12-30 – 2014-01-01 (×3): 80 mg via ORAL
  Filled 2013-12-30 (×3): qty 1

## 2013-12-30 MED ORDER — ONDANSETRON HCL 4 MG PO TABS: 4.0000 mg | ORAL_TABLET | ORAL | Status: DC | PRN

## 2013-12-30 MED ORDER — ONDANSETRON HCL 4 MG/2ML IJ SOLN: 4.0000 mg | Freq: Three times a day (TID) | INTRAMUSCULAR | Status: DC | PRN

## 2013-12-30 MED ORDER — PHENYLEPHRINE HCL 10 MG/ML IJ SOLN
INTRAMUSCULAR | Status: AC
  Filled 2013-12-30: qty 1

## 2013-12-30 MED ORDER — SCOPOLAMINE 1 MG/3DAYS TD PT72
MEDICATED_PATCH | TRANSDERMAL | Status: AC
  Filled 2013-12-30: qty 1

## 2013-12-30 MED ORDER — LIDOCAINE-EPINEPHRINE (PF) 2 %-1:200000 IJ SOLN
INTRAMUSCULAR | Status: AC
  Filled 2013-12-30: qty 20

## 2013-12-30 MED ORDER — MEPERIDINE HCL 25 MG/ML IJ SOLN: 6.2500 mg | INTRAMUSCULAR | Status: DC | PRN

## 2013-12-30 MED ORDER — KETOROLAC TROMETHAMINE 30 MG/ML IJ SOLN
30.0000 mg | Freq: Four times a day (QID) | INTRAMUSCULAR | Status: AC | PRN
  Administered 2013-12-30: 30 mg via INTRAMUSCULAR

## 2013-12-30 MED ORDER — SIMETHICONE 80 MG PO CHEW
80.0000 mg | CHEWABLE_TABLET | ORAL | Status: DC | PRN
Start: 2013-12-30 — End: 2014-01-02

## 2013-12-30 MED ORDER — IBUPROFEN 600 MG PO TABS
600.0000 mg | ORAL_TABLET | Freq: Four times a day (QID) | ORAL | Status: DC
  Administered 2013-12-30 – 2014-01-02 (×11): 600 mg via ORAL
  Filled 2013-12-30 (×11): qty 1

## 2013-12-30 MED ORDER — NALOXONE HCL 0.4 MG/ML IJ SOLN: 0.4000 mg | INTRAMUSCULAR | Status: DC | PRN

## 2013-12-30 MED ORDER — SODIUM BICARBONATE 8.4 % IV SOLN
INTRAVENOUS | Status: AC
  Filled 2013-12-30: qty 50

## 2013-12-30 MED ORDER — TETANUS-DIPHTH-ACELL PERTUSSIS 5-2.5-18.5 LF-MCG/0.5 IM SUSP
0.5000 mL | Freq: Once | INTRAMUSCULAR | Status: AC
  Administered 2013-12-31: 0.5 mL via INTRAMUSCULAR
  Filled 2013-12-30: qty 0.5

## 2013-12-30 MED ORDER — SCOPOLAMINE 1 MG/3DAYS TD PT72
1.0000 | MEDICATED_PATCH | Freq: Once | TRANSDERMAL | Status: DC
  Administered 2013-12-30: 1.5 mg via TRANSDERMAL

## 2013-12-30 MED ORDER — BUPIVACAINE IN DEXTROSE 0.75-8.25 % IT SOLN
INTRATHECAL | Status: DC | PRN
  Administered 2013-12-30: 1.8 mL via INTRATHECAL

## 2013-12-30 MED ORDER — OXYCODONE-ACETAMINOPHEN 5-325 MG PO TABS
1.0000 | ORAL_TABLET | ORAL | Status: DC | PRN
  Administered 2013-12-31 – 2014-01-02 (×2): 1 via ORAL
  Filled 2013-12-30 (×2): qty 1

## 2013-12-30 MED ORDER — SODIUM CHLORIDE 0.9 % IJ SOLN: 3.0000 mL | INTRAMUSCULAR | Status: DC | PRN

## 2013-12-30 MED ORDER — DIPHENHYDRAMINE HCL 50 MG/ML IJ SOLN: 12.5000 mg | INTRAMUSCULAR | Status: DC | PRN

## 2013-12-30 MED ORDER — ONDANSETRON HCL 4 MG/2ML IJ SOLN
INTRAMUSCULAR | Status: DC | PRN
  Administered 2013-12-30: 4 mg via INTRAVENOUS

## 2013-12-30 MED ORDER — LACTATED RINGERS IV SOLN
INTRAVENOUS | Status: DC | PRN
  Administered 2013-12-30 (×3): via INTRAVENOUS

## 2013-12-30 MED ORDER — HYDROMORPHONE HCL 1 MG/ML IJ SOLN: 0.2500 mg | INTRAMUSCULAR | Status: DC | PRN

## 2013-12-30 MED ORDER — WITCH HAZEL-GLYCERIN EX PADS: 1.0000 "application " | MEDICATED_PAD | CUTANEOUS | Status: DC | PRN

## 2013-12-30 MED ORDER — DIPHENHYDRAMINE HCL 25 MG PO CAPS: 25.0000 mg | ORAL_CAPSULE | Freq: Four times a day (QID) | ORAL | Status: DC | PRN

## 2013-12-30 MED ORDER — LANOLIN HYDROUS EX OINT: 1.0000 "application " | TOPICAL_OINTMENT | CUTANEOUS | Status: DC | PRN

## 2013-12-30 SURGICAL SUPPLY — 36 items
APL SKNCLS STERI-STRIP NONHPOA (GAUZE/BANDAGES/DRESSINGS) ×1
BENZOIN TINCTURE PRP APPL 2/3 (GAUZE/BANDAGES/DRESSINGS) ×1 IMPLANT
CLAMP CORD UMBIL (MISCELLANEOUS) ×1 IMPLANT
CLOTH BEACON ORANGE TIMEOUT ST (SAFETY) ×2 IMPLANT
COVER LIGHT HANDLE  1/PK (MISCELLANEOUS) ×2
COVER LIGHT HANDLE 1/PK (MISCELLANEOUS) ×2 IMPLANT
DRAPE SHEET LG 3/4 BI-LAMINATE (DRAPES) ×1 IMPLANT
DRSG OPSITE POSTOP 4X10 (GAUZE/BANDAGES/DRESSINGS) ×2 IMPLANT
DURAPREP 26ML APPLICATOR (WOUND CARE) ×2 IMPLANT
ELECT REM PT RETURN 9FT ADLT (ELECTROSURGICAL) ×2
ELECTRODE REM PT RTRN 9FT ADLT (ELECTROSURGICAL) ×1 IMPLANT
GLOVE BIO SURGEON STRL SZ 6 (GLOVE) ×3 IMPLANT
GLOVE BIO SURGEON STRL SZ 6.5 (GLOVE) ×4 IMPLANT
GLOVE BIO SURGEON STRL SZ7 (GLOVE) ×1 IMPLANT
GLOVE BIOGEL PI IND STRL 6.5 (GLOVE) IMPLANT
GLOVE BIOGEL PI INDICATOR 6.5 (GLOVE) ×4
GLOVE INDICATOR 6.0 STRL GRN (GLOVE) ×2 IMPLANT
GLOVE SURG SS PI 7.0 STRL IVOR (GLOVE) ×7 IMPLANT
GOWN STRL REUS W/TWL LRG LVL3 (GOWN DISPOSABLE) ×7 IMPLANT
NS IRRIG 1000ML POUR BTL (IV SOLUTION) ×2 IMPLANT
PACK C SECTION WH (CUSTOM PROCEDURE TRAY) ×2 IMPLANT
PAD OB MATERNITY 4.3X12.25 (PERSONAL CARE ITEMS) ×2 IMPLANT
STRIP CLOSURE SKIN 1/2X4 (GAUZE/BANDAGES/DRESSINGS) ×1 IMPLANT
SUT MNCRL 0 VIOLET CTX 36 (SUTURE) ×2 IMPLANT
SUT MNCRL AB 3-0 PS2 27 (SUTURE) ×2 IMPLANT
SUT MONOCRYL 0 CTX 36 (SUTURE) ×2
SUT PLAIN 0 NONE (SUTURE) ×1 IMPLANT
SUT PLAIN 2 0 (SUTURE) ×2
SUT PLAIN ABS 2-0 CT1 27XMFL (SUTURE) ×1 IMPLANT
SUT VIC AB 0 CT1 27 (SUTURE) ×4
SUT VIC AB 0 CT1 27XBRD ANBCTR (SUTURE) ×2 IMPLANT
SUT VIC AB 2-0 CT1 27 (SUTURE) ×2
SUT VIC AB 2-0 CT1 TAPERPNT 27 (SUTURE) IMPLANT
TOWEL OR 17X24 6PK STRL BLUE (TOWEL DISPOSABLE) ×4 IMPLANT
TRAY FOLEY CATH 14FR (SET/KITS/TRAYS/PACK) ×2 IMPLANT
WATER STERILE IRR 1000ML POUR (IV SOLUTION) ×2 IMPLANT

## 2013-12-30 NOTE — Addendum Note (Signed)
Addendum created 12/30/13 1539 by Collier FlowersElizabeth J Ragna Kramlich, CRNA   Modules edited: Notes Section   Notes Section:  File: [id number]

## 2013-12-30 NOTE — Anesthesia Procedure Notes (Signed)
Spinal  Patient location during procedure: OR Start time: 12/30/2013 12:09 PM Staffing Anesthesiologist: CASSIDY, AMY Performed by: anesthesiologist  Preanesthetic Checklist Completed: patient identified, site marked, surgical consent, pre-op evaluation, timeout performed, IV checked, risks and benefits discussed and monitors and equipment checked Spinal Block Patient position: sitting Prep: site prepped and draped and DuraPrep Patient monitoring: blood pressure, continuous pulse ox and heart rate Approach: midline Location: L3-4 Injection technique: single-shot Needle Needle type: Pencan  Needle gauge: 24 G Needle length: 10 cm Assessment Sensory level: T4 Additional Notes Clear free flow CSF on first attempt.  No paresthesia.  Patient tolerated procedure well with no apparent complications.  Jasmine DecemberA. Cassidy, MD

## 2013-12-30 NOTE — H&P (Addendum)
18 y.o. G2P0010 @ 5354w1d presents for primary cesarean section for malpresentation.  She failed an ECV at 38wks.     Past Medical History  Diagnosis Date  . Asthma     exercise induced  . ADHD (attention deficit hyperactivity disorder)   . Medical history non-contributory     Past Surgical History  Procedure Laterality Date  . Wisdom tooth extraction    . No past surgeries      OB History  Gravida Para Term Preterm AB SAB TAB Ectopic Multiple Living  2 0   1 1    0    # Outcome Date GA Lbr Len/2nd Weight Sex Delivery Anes PTL Lv  2 CUR           1 SAB               History   Social History  . Marital Status: Single    Spouse Name: N/A    Number of Children: N/A  . Years of Education: N/A   Occupational History  . Not on file.   Social History Main Topics  . Smoking status: Never Smoker   . Smokeless tobacco: Never Used  . Alcohol Use: No  . Drug Use: No  . Sexual Activity: Yes   Other Topics Concern  . Not on file   Social History Narrative  . No narrative on file   Review of patient's allergies indicates no known allergies.    Prenatal Transfer Tool  Maternal Diabetes: No Genetic Screening: Normal Maternal Ultrasounds/Referrals: complete breech, anterior placenta Fetal Ultrasounds or other Referrals:  None Maternal Substance Abuse:  No Significant Maternal Medications:  None Significant Maternal Lab Results: Lab values include: Group B Strep negative  Other PNC: uncomplicated.  Growth US 9/24 : EFW 2673 g (19%)    Filed Vitals:   12/30/13 0959  BP: 117/91  Pulse: 103  Temp: 98.2 F (36.8 C)  Resp: 16     General:  NAD Lungs: CTAB Cardiac: RRR Abdomen:  soft, gravid, EFW 6# Ex:  tr edema SVE:  deferred FHTs:  130s Toco:  Rare ctx  BSUS today confirms complete breech presentation.   A/P   18 y.o.  G2P0010 @ 11054w1d with malpresentation here for cesarean delivery. Consent reviewed, including risks of infection, bleeding, damage to  surrounding structures (including bowel, bladder, tubes, ovaries, baby), need for blood transfusion or additional procedures.  Consent signed.    [place], [facility]

## 2013-12-30 NOTE — Brief Op Note (Signed)
12/30/2013  1:20 PM  PATIENT:  Halford ChessmanBriana C Raden  18 y.o. female  PRE-OPERATIVE DIAGNOSIS:  BREECH  POST-OPERATIVE DIAGNOSIS:  BREECH  PROCEDURE:  Procedure(s): CESAREAN SECTION (N/A)  SURGEON:  Surgeon(s) and Role:    * Marlow Baarsyanna Kerra Guilfoil, MD - Primary    * Essie HartWalda Pinn, MD - Assisting  PHYSICIAN ASSISTANT:   ANESTHESIA:   spinal  EBL:  Total I/O In: 2300 [I.V.:2300] Out: 1100 [Urine:450; Blood:650]  BLOOD ADMINISTERED:none  DRAINS: Urinary Catheter (Foley)   LOCAL MEDICATIONS USED:  NONE  SPECIMEN:  Source of Specimen:  Placenta  DISPOSITION OF SPECIMEN:  to L&D  COUNTS:  YES  TOURNIQUET:  * No tourniquets in log *  DICTATION: .Note written in EPIC  PLAN OF CARE: Admit to inpatient   PATIENT DISPOSITION:  PACU - hemodynamically stable.   Delay start of Pharmacological VTE agent (>24hrs) due to surgical blood loss or risk of bleeding: not applicable

## 2013-12-30 NOTE — Anesthesia Postprocedure Evaluation (Signed)
  Anesthesia Post-op Note  Anesthesia Post Note  Patient: Alejandra Campbell  Procedure(s) Performed: Procedure(s) (LRB): CESAREAN SECTION (N/A)  Anesthesia type: Spinal  Patient location: PACU  Post pain: Pain level controlled  Post assessment: Post-op Vital signs reviewed  Last Vitals:  Filed Vitals:   12/30/13 1345  BP:   Pulse: 73  Temp:   Resp: 20    Post vital signs: Reviewed  Level of consciousness: awake  Complications: No apparent anesthesia complications

## 2013-12-30 NOTE — Transfer of Care (Signed)
Immediate Anesthesia Transfer of Care Note  Patient: Alejandra Campbell  Procedure(s) Performed: Procedure(s): CESAREAN SECTION (N/A)  Patient Location: PACU  Anesthesia Type:Spinal  Level of Consciousness: awake, alert , oriented and patient cooperative  Airway & Oxygen Therapy: Patient Spontanous Breathing  Post-op Assessment: Report given to PACU RN and Post -op Vital signs reviewed and stable  Post vital signs: Reviewed and stable  Complications: No apparent anesthesia complications

## 2013-12-30 NOTE — Anesthesia Postprocedure Evaluation (Signed)
  Anesthesia Post-op Note  Patient: Alejandra Campbell  Procedure(s) Performed: Procedure(s): CESAREAN SECTION (N/A)  Patient Location: Mother/Baby  Anesthesia Type:Spinal  Level of Consciousness: awake, alert , oriented and patient cooperative  Airway and Oxygen Therapy: Patient Spontanous Breathing  Post-op Pain: mild  Post-op Assessment: Post-op Vital signs reviewed, Patient's Cardiovascular Status Stable, Respiratory Function Stable, Patent Airway, No signs of Nausea or vomiting, Adequate PO intake and Pain level controlled  Post-op Vital Signs: Reviewed and stable  Last Vitals:  Filed Vitals:   12/30/13 1430  BP: 113/64  Pulse: 70  Temp:   Resp: 26    Complications: No apparent anesthesia complications

## 2013-12-30 NOTE — Anesthesia Preprocedure Evaluation (Signed)
Anesthesia Evaluation  Patient identified by MRN, date of birth, ID band Patient awake    Reviewed: Allergy & Precautions, H&P , Patient's Chart, lab work & pertinent test results  Airway Mallampati: II TM Distance: >3 FB Neck ROM: full    Dental no notable dental hx.    Pulmonary asthma (Chest clear, no recent difficulties) ,  breath sounds clear to auscultation  Pulmonary exam normal       Cardiovascular Exercise Tolerance: Good Rhythm:regular Rate:Normal     Neuro/Psych    GI/Hepatic   Endo/Other    Renal/GU      Musculoskeletal   Abdominal   Peds  Hematology   Anesthesia Other Findings   Reproductive/Obstetrics                           Anesthesia Physical Anesthesia Plan  ASA: II  Anesthesia Plan: Spinal   Post-op Pain Management:    Induction:   Airway Management Planned:   Additional Equipment:   Intra-op Plan:   Post-operative Plan:   Informed Consent: I have reviewed the patients History and Physical, chart, labs and discussed the procedure including the risks, benefits and alternatives for the proposed anesthesia with the patient or authorized representative who has indicated his/her understanding and acceptance.   Dental Advisory Given  Plan Discussed with: CRNA  Anesthesia Plan Comments: (Lab work confirmed with CRNA in room. Platelets okay. Discussed spinal anesthetic, and patient consents to the procedure:  included risk of possible headache,backache, failed block, allergic reaction, and nerve injury. This patient was asked if she had any questions or concerns before the procedure started. )        Anesthesia Quick Evaluation

## 2013-12-30 NOTE — Op Note (Signed)
Cesarean Section Procedure Note  Pre-operative Diagnosis: 1. Intrauterine pregnancy at [redacted]w[redacted]d  2. Breech presentation  Post-operative Diagnosis: same as above  Surgeon: Marlow Baars, MD  Assistants: Essie Hart, [facility]  Procedure: Primary low transverse cesarean section  Anesthesia: Spinal anesthesia  Estimated Blood Loss: 650 mL         Drains: Foley catheter         Specimens: Placenta to L&D         Implants: none         Complications:  None; patient tolerated the procedure well.         Disposition: PACU - hemodynamically stable.  Findings:  Normal tubes and ovaries bilaterally.  The uterus was abnormally shaped.  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  There were no abnormalities palpated within the uterine cavity.  The baby was in a completely breech presentation with head maternal left, Both tubes and ovaries present and normal.  Viable female infant, Apgars 9, 9.  Weight pending  Procedure Details   The patient was counseled about the risks, benefits, complications of the cesarean section as well as the permanent nature of a tubal ligation.  She elected to proceed with a tubal ligation. Consent was obtained.  The fetus was confirmed to be breech presentation with oligohydramnios in the preoperative holding.   After spinal anesthesia was found to adequate , the patient was placed in the dorsal supine position with a leftward tilt, draped and prepped in the usual sterile manner. A Pfannenstiel incision was made and carried down through the subcutaneous tissue to the fascia.  The fascia was incised in the midline and the fascial incision was extended laterally with Mayo scissors. The superior aspect of the fascial incision was grasped with two Kocher clamp, tented up and the rectus muscles dissected off bluntly. The rectus was then dissected off with blunt dissection and Mayo scissors inferiorly. The rectus muscles were  separated in the midline. The abdominal peritoneum was identified, entered bluntly, and the incision was extended superiorly and inferiorly with good visualization of the bladder. The vesicouterine peritoneum was identified.  A bladder blade was inserted. Scalpel was then used to make a low transverse incision on the uterus which was extended laterally with blunt dissection. The fluid was clear. The fetal breech was identified.  Both feet were grasped, and the fetus delivered from the footling breech position via the usual breech maneuvers.  The cord was clamped and cut and the infant was passed to the waiting neonatologist.  Placenta was then delivered spontaneously, intact and appear normal, the uterus was cleared of all clot and debris.   The hysterotomy was repaired with #0 Monocryl in running locked fashion.  The uterus was examined.  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  The cavity felt normal.  The right fallopian tube seemed to originated from the inferior aspect of the possible right uterine horn. Cannot rule out a fibroid at the cornua. The hysterotomy was reexamined and excellent hemostasis was noted.  The abdominal cavity was cleared of all clot and debris. The fascia and rectus muscles were inspected and were hemostatic. The fascia was closed with 0 Vicryl in a running fashion. The subcuticular layer was irrigated and all bleeders cauterized.  The subcutaneous layer was re approximated with interrupted 3-0 plain gut.  The skin was closed with 3-0 monocryl  in a subcuticular fashion. The incision was dressed with benzoine, steri strips and pressure dressing. All sponge lap and needle counts were correct x3. Patient tolerated the procedure well and recovered in stable condition following the procedure.

## 2013-12-31 LAB — CBC
HCT: 36.7 % (ref 36.0–46.0)
HEMOGLOBIN: 12.5 g/dL (ref 12.0–15.0)
MCH: 30.2 pg (ref 26.0–34.0)
MCHC: 34.1 g/dL (ref 30.0–36.0)
MCV: 88.6 fL (ref 78.0–100.0)
Platelets: 169 10*3/uL (ref 150–400)
RBC: 4.14 MIL/uL (ref 3.87–5.11)
RDW: 13 % (ref 11.5–15.5)
WBC: 6.9 10*3/uL (ref 4.0–10.5)

## 2013-12-31 LAB — BIRTH TISSUE RECOVERY COLLECTION (PLACENTA DONATION)

## 2013-12-31 NOTE — Progress Notes (Signed)
  Patient is eating, ambulating.  Pain control appropriate.  Bleeding minimal.  Foley just removed, has not yet voided.    Filed Vitals:   12/30/13 2230 12/31/13 0030 12/31/13 0230 12/31/13 0630  BP:  101/57 98/55 96/59   Pulse:  63 60 64  Temp: 98.6 F (37 C) 98.3 F (36.8 C) 98.6 F (37 C) 98 F (36.7 C)  TempSrc: Oral Oral Oral Oral  Resp: 18 16 16 16   Weight:      SpO2: 97% 96% 97% 98%    lungs:   clear to auscultation cor:    RRR Abdomen:  soft, appropriate tenderness, incisions intact and without erythema or exudate ex:    +SCDs, symmetric, tr edema  Lab Results  Component Value Date   WBC 6.9 12/31/2013   HGB 12.5 12/31/2013   HCT 36.7 12/31/2013   MCV 88.6 12/31/2013   PLT 169 12/31/2013    --/--/O POS (10/01 0920)/Rubella immune  A/P    Post operative day 1.  Doing well.  Hgb stable.   Declines circ.

## 2013-12-31 NOTE — Lactation Note (Signed)
This note was copied from the chart of Alejandra Maylene RoesBriana Hinderman. Lactation Consultation Note    Initial consult with this mom of  A term baby, now 2523 hours old. Mom has been doing well with breast feeding, and the baby has voided and stooled multiple times. On exam, mom's has easily expressed colostrum. She has reviewed the baby and me book already, and cluster feeding was reviewed with mom today. i helped mom [position the baby for football hold, using her breast feeding pillow. The baby flanges well, with good, deep latch, but was sleepy at time of consult. Mom knows to call for questions/concerns.   Patient Name: Alejandra Campbell ZOXWR'UToday's Date: 12/31/2013 Reason for consult: Initial assessment   Maternal Data Formula Feeding for Exclusion: No Has patient been taught Hand Expression?: Yes Does the patient have breastfeeding experience prior to this delivery?: No  Feeding Feeding Type: Breast Fed  LATCH Score/Interventions Latch: Repeated attempts needed to sustain latch, nipple held in mouth throughout feeding, stimulation needed to elicit sucking reflex. (I did latach teaching with the baby, but he was not cuiong and sleepy at this time) Intervention(s): Skin to skin;Teach feeding cues;Waking techniques Intervention(s): Adjust position;Assist with latch;Breast compression  Audible Swallowing: A few with stimulation Intervention(s): Skin to skin Intervention(s): Hand expression;Skin to skin  Type of Nipple: Everted at rest and after stimulation  Comfort (Breast/Nipple): Soft / non-tender     Hold (Positioning): Assistance needed to correctly position infant at breast and maintain latch. Intervention(s): Breastfeeding basics reviewed;Support Pillows;Position options;Skin to skin  LATCH Score: 7  Lactation Tools Discussed/Used     Consult Status Consult Status: Follow-up Date: 01/01/14 Follow-up type: In-patient    Alejandra Campbell, Alejandra Campbell 12/31/2013, 11:57 AM

## 2014-01-01 LAB — CBC
HCT: 34.4 % — ABNORMAL LOW (ref 36.0–46.0)
Hemoglobin: 11.5 g/dL — ABNORMAL LOW (ref 12.0–15.0)
MCH: 30.1 pg (ref 26.0–34.0)
MCHC: 33.4 g/dL (ref 30.0–36.0)
MCV: 90.1 fL (ref 78.0–100.0)
PLATELETS: 191 10*3/uL (ref 150–400)
RBC: 3.82 MIL/uL — ABNORMAL LOW (ref 3.87–5.11)
RDW: 13.1 % (ref 11.5–15.5)
WBC: 6.4 10*3/uL (ref 4.0–10.5)

## 2014-01-01 NOTE — Lactation Note (Addendum)
This note was copied from the chart of Alejandra Campbell Swider. Lactation Consultation Note Follow up visit at 55 hours.  Mom reports a little pain with latch on.  Baby latches well with wide flanged lips and rhythmic sucking for the first several minutes with audible swallows.   Baby became sleepy and stayed on the breast.  Encouraged mom to unlatch baby at the end of feeding and make sure nipple is round and extended.  Mom is able to hand express colostrum and encouraged to rub in EBM.  Reviewed basics and encouraged good pillow support.  Mom to call for assist as needed.      Patient Name: Alejandra Campbell Angus [id number]'XToday's Date: 01/01/2014 Reason for consult: Follow-up assessment;Breast/nipple pain   Maternal Data    Feeding Feeding Type: Breast Fed Length of feed:  (attempted)  LATCH Score/Interventions Latch: Repeated attempts needed to sustain latch, nipple held in mouth throughout feeding, stimulation needed to elicit sucking reflex. Intervention(s): Skin to skin;Teach feeding cues;Waking techniques Intervention(s): Breast compression  Audible Swallowing: Spontaneous and intermittent  Type of Nipple: Everted at rest and after stimulation  Comfort (Breast/Nipple): Soft / non-tender     Hold (Positioning): No assistance needed to correctly position infant at breast. Intervention(s): Breastfeeding basics reviewed;Support Pillows;Position options;Skin to skin  LATCH Score: 9  Lactation Tools Discussed/Used     Consult Status Consult Status: Follow-up Date: 01/02/14 Follow-up type: In-patient    Ciani Rutten, Arvella MerlesJana Lynn 01/01/2014, 8:22 PM

## 2014-01-01 NOTE — Progress Notes (Signed)
  Patient is eating, ambulating.  Pain control appropriate.  Bleeding minimal.  Voiding without difficulty.    Filed Vitals:   12/31/13 0230 12/31/13 0630 12/31/13 0958 12/31/13 1733  BP: 98/55 96/59 94/53  110/64  Pulse: 60 64 64 72  Temp: 98.6 F (37 C) 98 F (36.7 C) 98.3 F (36.8 C)   TempSrc: Oral Oral Oral   Resp: 16 16 18 18   Weight:      SpO2: 97% 98% 98%     lungs:   clear to auscultation cor:    RRR Abdomen:  soft, appropriate tenderness, incisions intact and without erythema or exudate ex:    symmetric, tr edema  Lab Results  Component Value Date   WBC 6.9 12/31/2013   HGB 12.5 12/31/2013   HCT 36.7 12/31/2013   MCV 88.6 12/31/2013   PLT 169 12/31/2013    --/--/O POS (10/01 0920)/Rubella immune  A/P    Post operative day 2.  Doing well.   Declines circ.  Discharge tomorrow.  Nexplanon for contraception PP

## 2014-01-02 MED ORDER — IBUPROFEN 600 MG PO TABS: 600.0000 mg | ORAL_TABLET | Freq: Four times a day (QID) | ORAL | Status: AC | PRN

## 2014-01-02 MED ORDER — OXYCODONE-ACETAMINOPHEN 5-325 MG PO TABS: 1.0000 | ORAL_TABLET | ORAL | Status: AC | PRN

## 2014-01-02 MED ORDER — DOCUSATE SODIUM 100 MG PO CAPS: 100.0000 mg | ORAL_CAPSULE | Freq: Two times a day (BID) | ORAL | Status: AC

## 2014-01-02 NOTE — Progress Notes (Signed)
Per Md instruction, old honeycomb dressing removed; incision visualized by patient and home care reviewed with patient. Steris strips intact, incision approximated, no drainage noted. New honeycomb dressing was applied with removal instructions given. Patient verbalized her understanding. Boykin PeekNancy Finlay Mills, RN

## 2014-01-02 NOTE — Discharge Summary (Signed)
Obstetric Discharge Summary Reason for Admission: cesarean section Prenatal Procedures: ultrasound Intrapartum Procedures: cesarean: low cervical, transverse Postpartum Procedures: none Complications-Operative and Postpartum: none Hemoglobin  Date Value Ref Range Status  01/01/2014 11.5* 12.0 - 15.0 g/dL Final     HCT  Date Value Ref Range Status  01/01/2014 34.4* 36.0 - 46.0 % Final    Physical Exam:  General: alert, cooperative and appears stated age 20Lochia: appropriate Uterine Fundus: firm Incision: healing well DVT Evaluation: No evidence of DVT seen on physical exam.  Discharge Diagnoses: Term Pregnancy-delivered and Fetal malpresentation  Discharge Information: Date: 01/02/2014 Activity: pelvic rest Diet: routine Medications: Ibuprofen, Colace and Percocet Condition: improved Instructions: refer to practice specific booklet Discharge to: home Follow-up Information   Follow up with Marlow BaarsLARK, DYANNA, MD In 4 weeks. (For a postpartum evaluation)    Specialty:  Obstetrics   Contact information:   [address] [place] [id number] [phone]       Newborn Data: Live born female  Birth Weight: 6 lb 13.2 oz (3095 g) APGAR: 9, 9  Home with mother.  Aarsh Fristoe H. 01/02/2014, 8:50 AM

## 2014-01-03 ENCOUNTER — Encounter (HOSPITAL_COMMUNITY): Payer: Self-pay | Admitting: Obstetrics

## 2014-01-05 ENCOUNTER — Encounter: Payer: Self-pay | Admitting: Pediatrics

## 2014-01-21 IMAGING — CR DG OS CALCIS 2+V*L*
1 series · 1 of 1 positions shown · non-contrast
Comparison: None.

CLINICAL DATA: Left heel pain

EXAM:
LEFT OS CALCIS - 2+ VIEW

[axial]
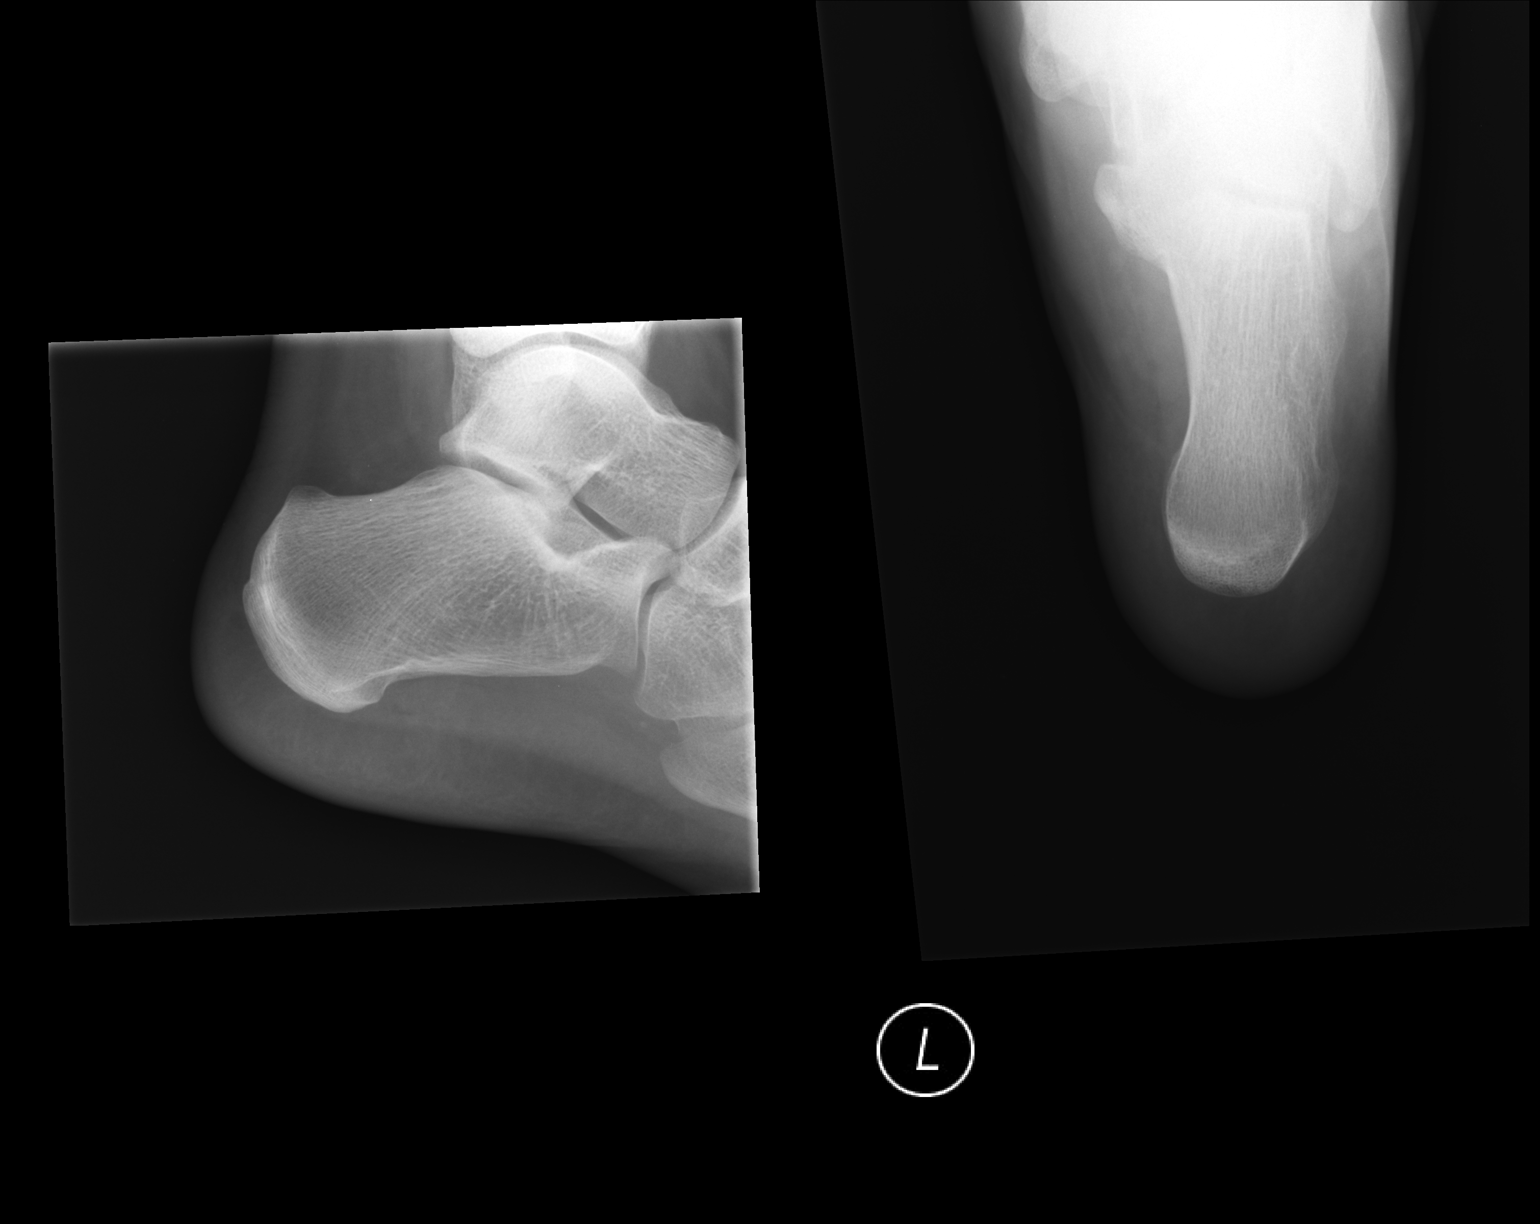

[1 of 1 positions shown; findings below may reference images not displayed]

FINDINGS: Two views of left calcaneus submitted. No acute fracture or
subluxation. No radiopaque foreign body.
IMPRESSION: No acute fracture or subluxation.

## 2014-01-30 ENCOUNTER — Encounter (HOSPITAL_COMMUNITY): Payer: Self-pay | Admitting: Obstetrics
